# Patient Record
Sex: Male | Born: 1990 | Race: Black or African American | Hispanic: No | Marital: Single | Smoking: Current every day smoker
Health system: Southern US, Community
[De-identification: ages and names within clinical notes are randomized; demographics above are authoritative.]

## PROBLEM LIST (undated history)

## (undated) HISTORY — PX: HERNIA REPAIR: SHX51

---

## 2018-08-21 ENCOUNTER — Emergency Department (HOSPITAL_BASED_OUTPATIENT_CLINIC_OR_DEPARTMENT_OTHER): Payer: Managed Care, Other (non HMO)

## 2018-08-21 ENCOUNTER — Encounter (HOSPITAL_BASED_OUTPATIENT_CLINIC_OR_DEPARTMENT_OTHER): Payer: Self-pay | Admitting: Emergency Medicine

## 2018-08-21 ENCOUNTER — Emergency Department (HOSPITAL_BASED_OUTPATIENT_CLINIC_OR_DEPARTMENT_OTHER)
Admission: EM | Admit: 2018-08-21 | Discharge: 2018-08-21 | Disposition: A | Discharge status: Home / Self Care | Payer: Managed Care, Other (non HMO) | Attending: Emergency Medicine | Admitting: Emergency Medicine

## 2018-08-21 ENCOUNTER — Other Ambulatory Visit: Payer: Self-pay

## 2018-08-21 DIAGNOSIS — Z23 Encounter for immunization: Secondary | ICD-10-CM | POA: Diagnosis not present

## 2018-08-21 DIAGNOSIS — W25XXXA Contact with sharp glass, initial encounter: Secondary | ICD-10-CM | POA: Diagnosis not present

## 2018-08-21 DIAGNOSIS — Y92009 Unspecified place in unspecified non-institutional (private) residence as the place of occurrence of the external cause: Secondary | ICD-10-CM | POA: Insufficient documentation

## 2018-08-21 DIAGNOSIS — F17290 Nicotine dependence, other tobacco product, uncomplicated: Secondary | ICD-10-CM | POA: Insufficient documentation

## 2018-08-21 DIAGNOSIS — S81011A Laceration without foreign body, right knee, initial encounter: Secondary | ICD-10-CM | POA: Insufficient documentation

## 2018-08-21 DIAGNOSIS — T07XXXA Unspecified multiple injuries, initial encounter: Secondary | ICD-10-CM

## 2018-08-21 DIAGNOSIS — Y999 Unspecified external cause status: Secondary | ICD-10-CM | POA: Diagnosis not present

## 2018-08-21 DIAGNOSIS — Y9301 Activity, walking, marching and hiking: Secondary | ICD-10-CM | POA: Diagnosis not present

## 2018-08-21 MED ORDER — LIDOCAINE HCL (PF) 1 % IJ SOLN
10.0000 mL | Freq: Once | INTRAMUSCULAR | Status: AC
  Administered 2018-08-21: 10 mL
  Filled 2018-08-21: qty 10

## 2018-08-21 MED ORDER — TETANUS-DIPHTH-ACELL PERTUSSIS 5-2.5-18.5 LF-MCG/0.5 IM SUSP
0.5000 mL | Freq: Once | INTRAMUSCULAR | Status: AC
  Administered 2018-08-21: 0.5 mL via INTRAMUSCULAR
  Filled 2018-08-21: qty 0.5

## 2018-08-21 MED ORDER — LIDOCAINE-EPINEPHRINE (PF) 2 %-1:200000 IJ SOLN
10.0000 mL | Freq: Once | INTRAMUSCULAR | Status: AC
  Administered 2018-08-21: 10 mL
  Filled 2018-08-21 (×2): qty 10

## 2018-08-21 NOTE — ED Triage Notes (Signed)
Pt reports laceration to right leg, pt seen at urgent care. Pt reports that he fell through a glass table today.

## 2018-08-21 NOTE — Discharge Instructions (Signed)
You were seen in the emergency department today for multiple lacerations your wounds were closed with a total of 18 topical stitches as well as 2 absorbable stitches.  The 18 topical stitches will need to be removed in 10 days.  Please keep this area clean and dry for the next 24 hours, after 24 hours you may get this area wet, but avoid soaking the area. Keep the area covered as best possible especially when in the sun to help in minimizing scarring.  We have placed you in a knee immobilizer to prevent the wounds from opening.  Please keep this on at all times.  Your tetanus has been updated  Discussed your x-ray showed a small potential foreign body, we are unsure if this was removed, this is just something to be aware of.  You will need to have the stitches removed and the wound rechecked in 10 days. Please return to the emergency department, go to an urgent care, or see your primary care provider to have this performed. Return to the ER soon should you start to experience pus type drainage from the wound, redness around the wound, or fevers as this could indicate the area is infected, please return to the ER for any other worsening symptoms or concerns that you may have.

## 2018-08-21 NOTE — ED Provider Notes (Signed)
[facility] EMERGENCY DEPARTMENT Provider Note   CSN: [id number] Arrival date & time: 08/21/18  1341    History   Chief Complaint Chief Complaint  Patient presents with   Laceration    HPI John Benjamin. is a 28 y.o. male with tobacco abuse who presents to the ER from UC for R knee injury which occurred at 10:00 AM today. Patient states he tripped and fell on his knee into a glass table. The table shattered resulting in multiple lacerations to the R knee. He denies head injury or LOC. No other injuries sustained. He notes he has multiple wounds to the R knee that are uncomfortable. He washed the wounds PTA with water in the shower. No other intervention or alleviating/aggravating factors. Went to UC sent to the ER due to extent of wounds. Denies headache, neck pain, numbness, tingling, or weakness. Unknown last tetanus.      HPI  History reviewed. No pertinent past medical history.  There are no active problems to display for this patient.   Past Surgical History:  Procedure Laterality Date   HERNIA REPAIR          Home Medications    Prior to Admission medications   Not on File    Family History No family history on file.  Social History Social History   Tobacco Use   Smoking status: Current Every Day Smoker    Types: Cigars   Smokeless tobacco: Never Used  Substance Use Topics   Alcohol use: Yes   Drug use: Not Currently     Allergies   Patient has no known allergies.   Review of Systems Review of Systems  Constitutional: Negative for chills and fever.  Respiratory: Negative for shortness of breath.   Cardiovascular: Negative for chest pain.  Gastrointestinal: Negative for abdominal pain.  Musculoskeletal: Positive for arthralgias. Negative for back pain and neck pain.  Skin: Positive for wound.  Neurological: Negative for weakness, numbness and headaches.  All other systems reviewed and are negative.    Physical  Exam Updated Vital Signs BP (!) 154/107 (BP Location: Left Arm)    Pulse 86    Temp 98.5 F (36.9 C) (Oral)    Resp 18    Ht 5\' 8"  (1.727 m)    Wt 72.6 kg    SpO2 100%    BMI 24.33 kg/m   Physical Exam Vitals signs and nursing note reviewed.  Constitutional:      General: He is not in acute distress.    Appearance: He is not ill-appearing or toxic-appearing.  HENT:     Head: Normocephalic and atraumatic.  Cardiovascular:     Rate and Rhythm: Normal rate and regular rhythm.     Pulses:          Dorsalis pedis pulses are 2+ on the right side and 2+ on the left side.       Posterior tibial pulses are 2+ on the right side and 2+ on the left side.  Pulmonary:     Effort: Pulmonary effort is normal.     Breath sounds: Normal breath sounds.  Chest:     Chest wall: No tenderness.  Abdominal:     General: There is no distension.     Tenderness: There is no abdominal tenderness. There is no guarding or rebound.  Musculoskeletal:     Comments: Lower extremities: R knee: Patient has multiple lacerations to the anterior right knee.  There is a gaping 10 cm length  laceration with muscle exposure to the superior lateral knee.  There are 2 lacerations over the patella, 1 to the lateral aspect is 3 cm in diameter, the medial aspect laceration is 2.5 cm in diameter.  There is an additional abnormally shaped gaping laceration just superior to the patellar region which is approximately 4 cm in diameter with abnormal skin flap.  There is a additional wound just superior to this that is 1.5 cm in diameter with abnormal skin flap as well.  There is mild slow bleeding noted to the largest laceration to the lateral aspect of the leg, this is not pulsatile in nature.  This resolves with pressure being applied.  All other wounds are not bleeding at this time.  No visible foreign bodies.  Mild swelling noted to the area of her largest laceration.  No ecchymosis.  Patient has intact active range of motion to bilateral  hips, knees, ankles, and all digits.  He is tender mildly over his lacerations but without point/focal bony tenderness. Back: No midline tenderness Upper extremities: Normal active range of motion throughout nontender.  Skin:    General: Skin is warm and dry.     Capillary Refill: Capillary refill takes less than 2 seconds.  Neurological:     Mental Status: He is alert.     Comments: Alert. Clear speech. Sensation grossly intact to bilateral lower extremities. 5/5 strength with plantar/dorsiflexion bilaterally. Patient ambulatory with  gait, no foot drop noted.   Psychiatric:        Mood and Affect: Mood normal.        Behavior: Behavior normal.        ED Treatments / Results  Labs (all labs ordered are listed, but only abnormal results are displayed) Labs Reviewed - No data to display  EKG None  Radiology Dg Knee Complete 4 Views Right  Result Date: 08/21/2018 CLINICAL DATA:  Lacerations after fall through table. EXAM: RIGHT KNEE - COMPLETE 4+ VIEW COMPARISON:  None. FINDINGS: There is a calcification in the soft tissues adjacent to the medial femoral metaphysis, likely from a previous medial collateral ligament injury. This calcification is well corticated and thought to be nonacute. No acute fractures are seen. There is a sliver of high attenuation along the superior aspect of the laceration in the medial distal thigh consistent with a foreign body. IMPRESSION: 1. There is a sliver of high attenuation along the superior aspect of the laceration in the medial distal thigh consistent with a foreign body. 2. 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 3. No acute fractures. Electronically Signed   By: Gerome Sam III M.D   On: 08/21/2018 15:25    Procedures .Marland KitchenLaceration Repair Date/Time: 08/21/2018 4:30 PM Performed by: Cherly Anderson, PA-C Authorized by: Cherly Anderson, PA-C   Consent:    Consent obtained:  Verbal   Consent given by:   Patient   Risks discussed:  Infection, need for additional repair, nerve damage, poor wound healing, poor cosmetic result, pain, retained foreign body, tendon damage and vascular damage   Alternatives discussed:  No treatment Anesthesia (see MAR for exact dosages):    Anesthesia method:  Local infiltration   Local anesthetic:  Lidocaine 2% WITH epi Laceration details:    Location:  Leg   Leg location:  R knee   Length (cm):  10 Repair type:    Repair type:  Intermediate Pre-procedure details:    Preparation:  Patient was prepped and draped in usual sterile fashion and  imaging obtained to evaluate for foreign bodies Exploration:    Hemostasis achieved with:  Direct pressure   Wound exploration: wound explored through full range of motion and entire depth of wound probed and visualized   Treatment:    Area cleansed with:  Betadine   Amount of cleaning:  Extensive   Irrigation solution:  Sterile water   Irrigation method:  Pressure wash Subcutaneous repair:    Suture size:  3-0   Suture material:  Vicryl   Suture technique:  Simple interrupted   Number of sutures:  2 Skin repair:    Repair method:  Sutures   Suture size:  4-0   Suture material:  Nylon   Number of sutures:  9 Approximation:    Approximation:  Close Post-procedure details:    Dressing:  Antibiotic ointment, splint for protection and non-adherent dressing   Patient tolerance of procedure:  Tolerated well, no immediate complications  .Marland KitchenLaceration Repair Date/Time: 08/21/2018 4:30 PM Performed by: Cherly Anderson, PA-C Authorized by: Cherly Anderson, PA-C   Consent:    Consent obtained:  Verbal   Consent given by:  Patient   Risks discussed:  Infection, need for additional repair, nerve damage, poor wound healing, poor cosmetic result, pain, retained foreign body, tendon damage and vascular damage   Alternatives discussed:  No treatment Anesthesia (see MAR for exact dosages):    Anesthesia  method:  Local infiltration   Local anesthetic:  Lidocaine 2% WITH epi Laceration details:    Location:  Leg   Leg location:  R knee   Length (cm):  3 Repair type:    Repair type:  Simple Pre-procedure details:    Preparation:  Patient was prepped and draped in usual sterile fashion and imaging obtained to evaluate for foreign bodies Exploration:    Hemostasis achieved with:  Direct pressure   Wound exploration: wound explored through full range of motion and entire depth of wound probed and visualized   Treatment:    Area cleansed with:  Betadine   Amount of cleaning:  Extensive   Irrigation solution:  Sterile water   Irrigation method:  Pressure wash Skin repair:    Repair method:  Sutures   Suture size:  4-0   Suture material:  Nylon   Suture technique:  Simple interrupted   Number of sutures:  2 Approximation:    Approximation:  Close Post-procedure details:    Dressing:  Antibiotic ointment, splint for protection and non-adherent dressing   Patient tolerance of procedure:  Tolerated well, no immediate complications .Marland KitchenLaceration Repair Date/Time: 08/21/2018 4:30 PM Performed by: Cherly Anderson, PA-C Authorized by: Cherly Anderson, PA-C   Consent:    Consent obtained:  Verbal   Consent given by:  Patient   Risks discussed:  Infection, need for additional repair, nerve damage, poor wound healing, poor cosmetic result, pain, retained foreign body, tendon damage and vascular damage   Alternatives discussed:  No treatment Anesthesia (see MAR for exact dosages):    Anesthesia method:  Local infiltration   Local anesthetic:  Lidocaine 2% WITH epi Laceration details:    Location:  Leg   Leg location:  R knee   Length (cm):  2.5 Repair type:    Repair type:  Simple Pre-procedure details:    Preparation:  Patient was prepped and draped in usual sterile fashion and imaging obtained to evaluate for foreign bodies Exploration:    Hemostasis achieved with:   Direct pressure   Wound exploration: wound explored  through full range of motion and entire depth of wound probed and visualized   Treatment:    Area cleansed with:  Betadine   Amount of cleaning:  Extensive   Irrigation solution:  Sterile water   Irrigation method:  Pressure wash Skin repair:    Repair method:  Sutures   Suture size:  4-0   Suture material:  Nylon   Suture technique:  Simple interrupted   Number of sutures:  2 Approximation:    Approximation:  Close Post-procedure details:    Dressing:  Antibiotic ointment, splint for protection and non-adherent dressing   Patient tolerance of procedure:  Tolerated well, no immediate complications .Marland Kitchen.Laceration Repair Date/Time: 08/21/2018 6:35 PM Performed by: Cherly AndersonPetrucelli, Carnesha Maravilla R, PA-C Authorized by: Cherly AndersonPetrucelli, Rika Daughdrill R, PA-C   Consent:    Consent obtained:  Verbal   Consent given by:  Patient   Risks discussed:  Infection, need for additional repair, nerve damage, poor wound healing, poor cosmetic result, pain, retained foreign body, tendon damage and vascular damage   Alternatives discussed:  No treatment Anesthesia (see MAR for exact dosages):    Anesthesia method:  Local infiltration   Local anesthetic:  Lidocaine 2% WITH epi Laceration details:    Location:  Leg   Leg location:  R knee   Length (cm):  4 Repair type:    Repair type:  Simple Pre-procedure details:    Preparation:  Patient was prepped and draped in usual sterile fashion and imaging obtained to evaluate for foreign bodies Exploration:    Hemostasis achieved with:  Direct pressure   Wound exploration: wound explored through full range of motion and entire depth of wound probed and visualized   Treatment:    Area cleansed with:  Betadine   Amount of cleaning:  Extensive   Irrigation solution:  Sterile water   Irrigation method:  Pressure wash Skin repair:    Repair method:  Sutures   Suture size:  4-0   Suture material:  Nylon   Suture  technique:  Simple interrupted   Number of sutures:  4 (1 placed to approximate wound edges, 3 across wound) Approximation:    Approximation:  Close Post-procedure details:    Dressing:  Antibiotic ointment, splint for protection and non-adherent dressing   Patient tolerance of procedure:  Tolerated well, no immediate complications .Marland Kitchen.Laceration Repair Date/Time: 08/21/2018 6:36 PM Performed by: Cherly AndersonPetrucelli, Claiborne Stroble R, PA-C Authorized by: Cherly AndersonPetrucelli, Wynton Hufstetler R, PA-C   Consent:    Consent obtained:  Verbal   Consent given by:  Patient   Risks discussed:  Infection, need for additional repair, nerve damage, poor wound healing, poor cosmetic result, pain, retained foreign body, tendon damage and vascular damage   Alternatives discussed:  No treatment Anesthesia (see MAR for exact dosages):    Anesthesia method:  Local infiltration   Local anesthetic:  Lidocaine 2% WITH epi Laceration details:    Location:  Leg   Leg location:  R knee   Length (cm):  1.5 Repair type:    Repair type:  Simple Pre-procedure details:    Preparation:  Patient was prepped and draped in usual sterile fashion and imaging obtained to evaluate for foreign bodies Exploration:    Hemostasis achieved with:  Direct pressure   Wound exploration: wound explored through full range of motion and entire depth of wound probed and visualized   Treatment:    Area cleansed with:  Betadine   Amount of cleaning:  Extensive   Irrigation solution:  Sterile water  Irrigation method:  Pressure wash Skin repair:    Repair method:  Sutures   Suture size:  4-0   Suture material:  Nylon   Suture technique:  Simple interrupted   Number of sutures:  1 Approximation:    Approximation:  Close Post-procedure details:    Dressing:  Antibiotic ointment, splint for protection and non-adherent dressing   Patient tolerance of procedure:  Tolerated well, no immediate complications   (including critical care time)   SPLINT  APPLICATION Date/Time: 5:03 PM Authorized by: Harvie Heck Consent: Verbal consent obtained. Risks and benefits: risks, benefits and alternatives were discussed Consent given by: patient Splint applied by:ED technician Location details: LLE Splint type: Knee immobilizer Supplies used: knee immobilizer  Post-procedure: The splinted body part was neurovascularly unchanged following the procedure. Patient tolerance: Patient tolerated the procedure well with no immediate complications.   Medications Ordered in ED Medications  lidocaine-EPINEPHrine (XYLOCAINE W/EPI) 2 %-1:200000 (PF) injection 10 mL (10 mLs Infiltration Given by Other 08/21/18 1419)  lidocaine (PF) (XYLOCAINE) 1 % injection 10 mL (10 mLs Infiltration Given by Other 08/21/18 1419)  Tdap (BOOSTRIX) injection 0.5 mL (0.5 mLs Intramuscular Given 08/21/18 1727)     Initial Impression / Assessment and Plan / ED Course  I have reviewed the triage vital signs and the nursing notes.  Pertinent labs & imaging results that were available during my care of the patient were reviewed by me and considered in my medical decision making (see chart for details).  Patient presents to the emergency department status post mechanical fall with multiple lacerations to to the R knee. No evidence of serious head/neck/back/intra abdominal/thoracic injury on exam. X-ray obtained- interpretation impression per radiology: there is a sliver of high attenuation along the superior aspect of the laceration in the medial distal thigh consistent with a foreign body. 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 No acute fractures  Patient confirmed prior MCL injury.  We discussed foreign body noted on x-ray.  I personally reviewed the films. No obvious FB on initial investigation. Pressure irrigation performed. Wounds explored and base of wound visualized in a bloodless, no FB appreciated after copious irrigation. We discussed option of  repeat x-ray prior to closure to determine status of possible FB noted on prior films, risks/benefits discussed, patient declined x-ray. Laceration repairs per procedure notes above, tolerated well. Tetanus updated at today's visit. Do not feel that abx are indicated at this time based on wound appearance and lack of significant comorbidities. Placed in knee immobilizer for protection based on location. Discussed suture home care as well as need for wound recheck and suture removal.  I discussed results, treatment plan, need for follow-up, and return precautions with the patient including signs of infection. Provided opportunity for questions, patient confirmed understanding and is in agreement with plan.    This is a shared visit with supervising physician Dr. Denton Lank who has independently evaluated patient & is in agreement.    Final Clinical Impressions(s) / ED Diagnoses   Final diagnoses:  Multiple lacerations    ED Discharge Orders    None       Cherly Anderson, PA-C 08/21/18 1939    Cathren Laine, MD 08/23/18 1541

## 2020-10-05 IMAGING — DX RIGHT KNEE - COMPLETE 4+ VIEW
4 series · 4 of 4 positions shown · non-contrast
Comparison: None.

CLINICAL DATA: Lacerations after fall through table.

EXAM:
RIGHT KNEE - COMPLETE 4+ VIEW

[knee ap]
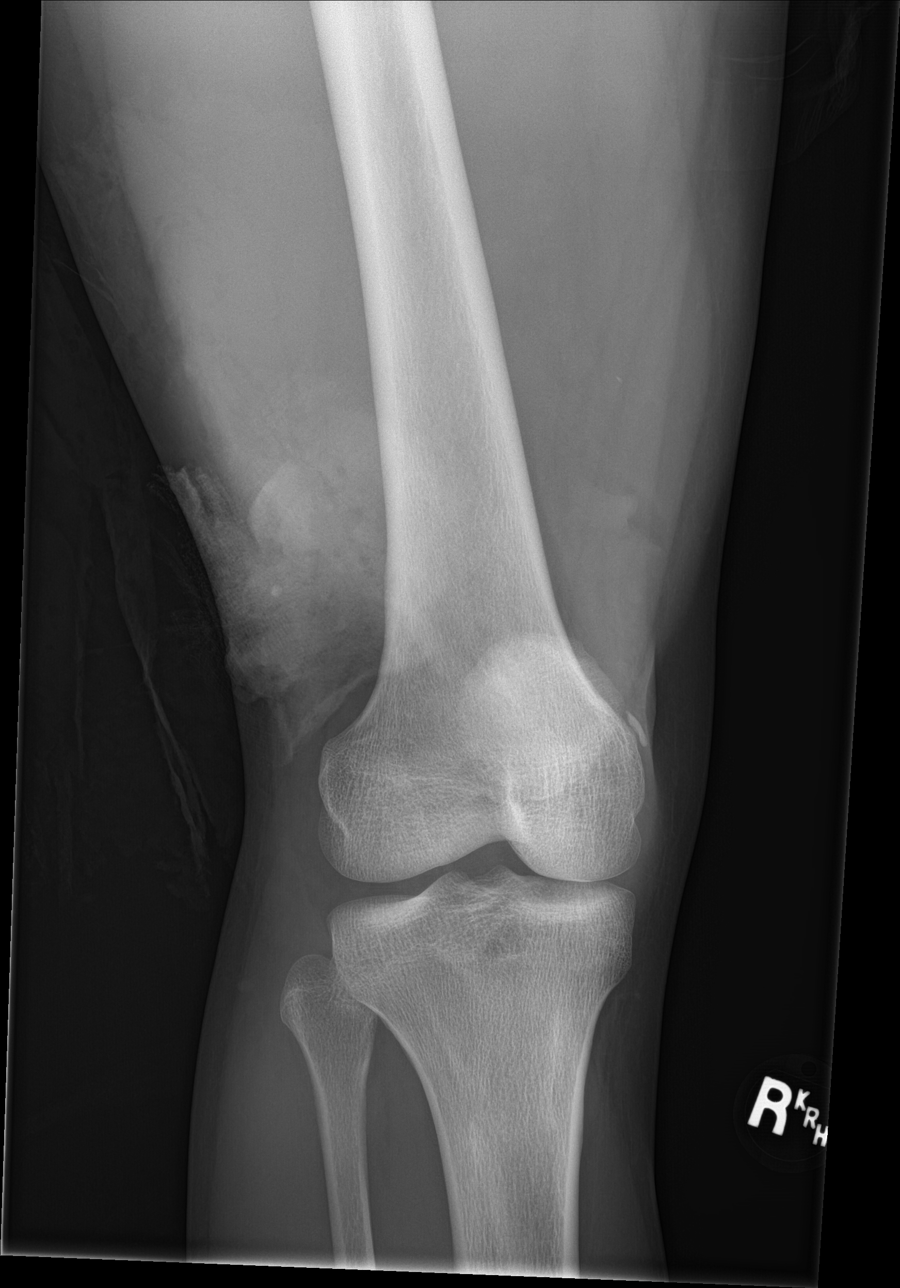

[knee lat]
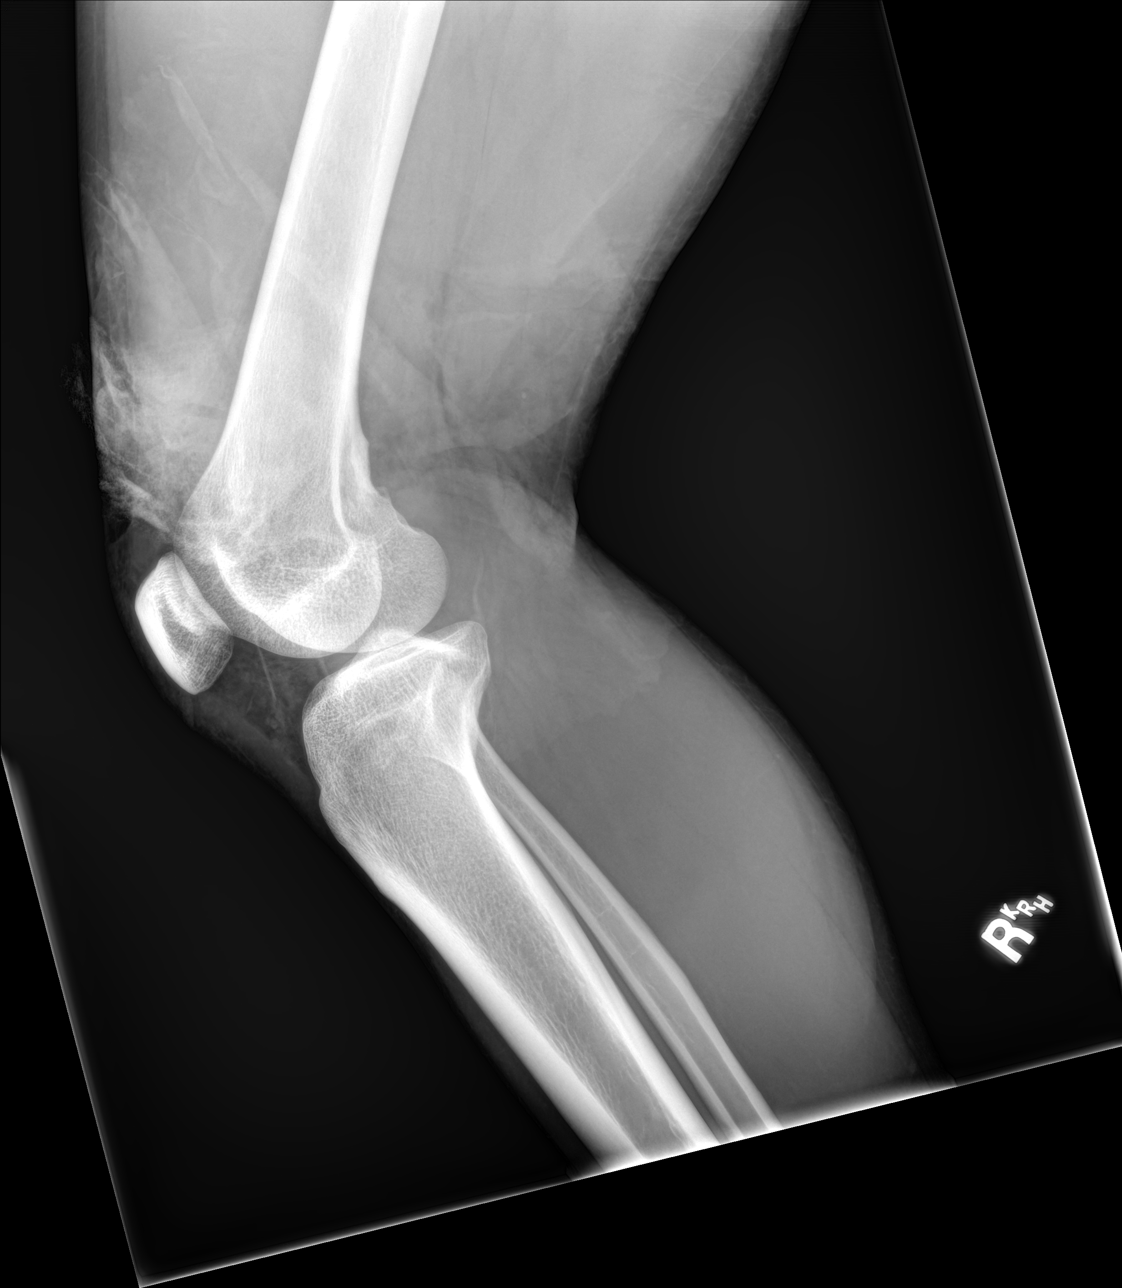

[knee obl (1 of 2)]
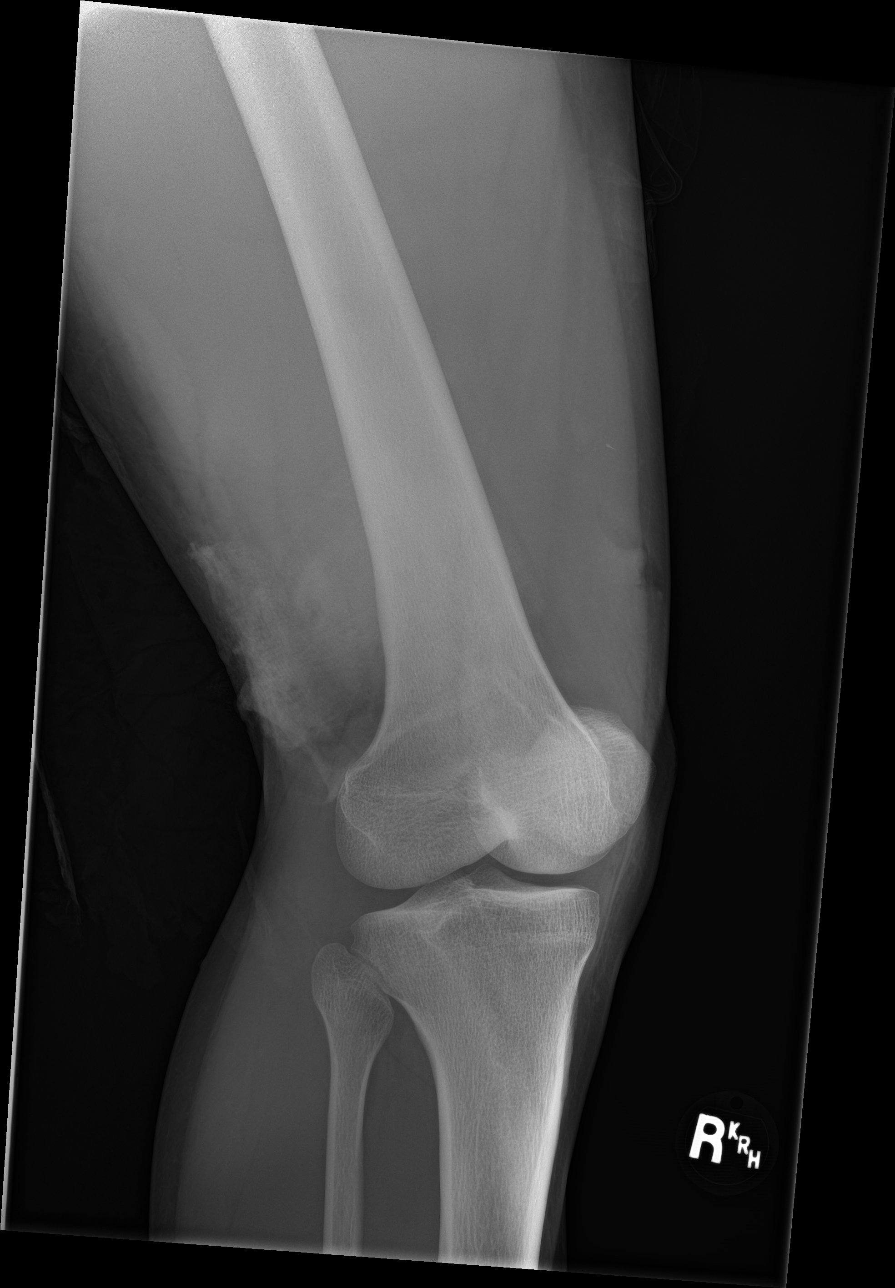

[knee obl (2 of 2)]
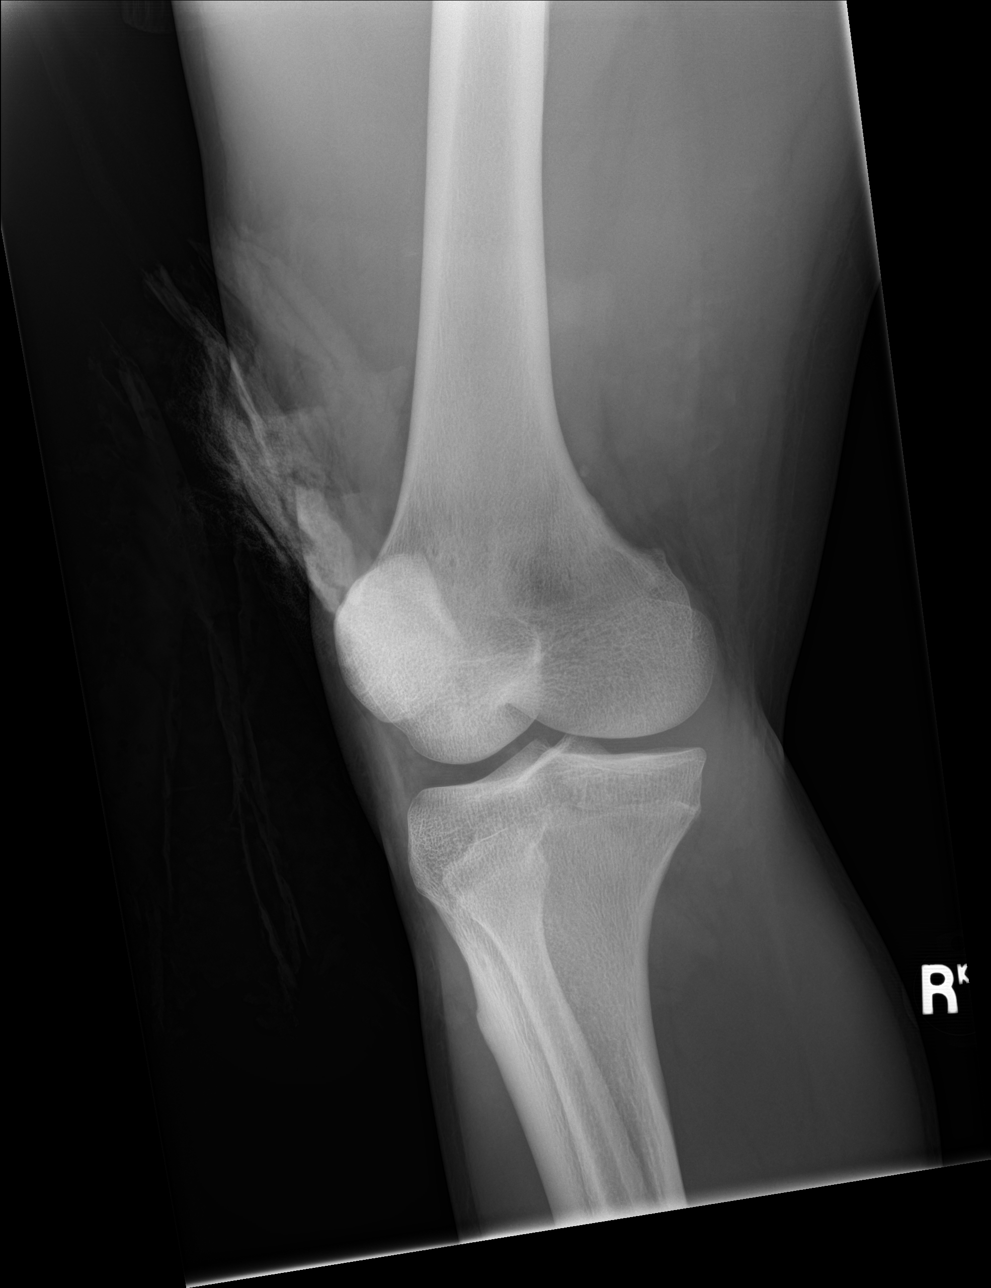

[4 of 4 positions shown; findings below may reference images not displayed]

FINDINGS: There is a calcification in the soft tissues adjacent to the medial
femoral metaphysis, likely from a previous medial collateral
ligament injury. This calcification is well corticated and thought
to be nonacute. No acute fractures are seen. There is a sliver of
high attenuation along the superior aspect of the laceration in the
medial distal thigh consistent with a foreign body.
IMPRESSION: 1. There is a sliver of high attenuation along the superior aspect
of the laceration in the medial distal thigh consistent with a
foreign body.
2. Probable sequela of chronic medial collateral ligament injury.
3. No acute fractures.

## 2022-07-09 DEATH — deceased
